# Patient Record
Sex: Female | Born: 1940 | Race: White | Hispanic: No | Marital: Married | Smoking: Never smoker
Health system: Southern US, Community
[De-identification: ages and names within clinical notes are randomized; demographics above are authoritative.]

## PROBLEM LIST (undated history)

## (undated) DIAGNOSIS — L4050 Arthropathic psoriasis, unspecified: Secondary | ICD-10-CM

## (undated) DIAGNOSIS — E559 Vitamin D deficiency, unspecified: Secondary | ICD-10-CM

## (undated) DIAGNOSIS — I10 Essential (primary) hypertension: Secondary | ICD-10-CM

## (undated) DIAGNOSIS — E785 Hyperlipidemia, unspecified: Secondary | ICD-10-CM

## (undated) DIAGNOSIS — L821 Other seborrheic keratosis: Secondary | ICD-10-CM

## (undated) DIAGNOSIS — M722 Plantar fascial fibromatosis: Secondary | ICD-10-CM

## (undated) DIAGNOSIS — K219 Gastro-esophageal reflux disease without esophagitis: Secondary | ICD-10-CM

## (undated) HISTORY — DX: Essential (primary) hypertension: I10

## (undated) HISTORY — DX: Other seborrheic keratosis: L82.1

## (undated) HISTORY — PX: TOTAL ABDOMINAL HYSTERECTOMY: SHX209

## (undated) HISTORY — DX: Hyperlipidemia, unspecified: E78.5

## (undated) HISTORY — DX: Plantar fascial fibromatosis: M72.2

## (undated) HISTORY — DX: Arthropathic psoriasis, unspecified: L40.50

## (undated) HISTORY — DX: Vitamin D deficiency, unspecified: E55.9

## (undated) HISTORY — DX: Gastro-esophageal reflux disease without esophagitis: K21.9

---

## 2003-02-02 ENCOUNTER — Ambulatory Visit (HOSPITAL_COMMUNITY): Admission: RE | Admit: 2003-02-02 | Discharge: 2003-02-02 | Payer: Self-pay | Admitting: *Deleted

## 2003-08-19 ENCOUNTER — Other Ambulatory Visit: Admission: RE | Admit: 2003-08-19 | Discharge: 2003-08-19 | Payer: Self-pay | Admitting: Internal Medicine

## 2006-07-10 ENCOUNTER — Other Ambulatory Visit: Admission: RE | Admit: 2006-07-10 | Discharge: 2006-07-10 | Payer: Self-pay | Admitting: Internal Medicine

## 2008-08-14 ENCOUNTER — Other Ambulatory Visit: Admission: RE | Admit: 2008-08-14 | Discharge: 2008-08-14 | Payer: Self-pay | Admitting: Internal Medicine

## 2009-08-17 ENCOUNTER — Other Ambulatory Visit: Admission: RE | Admit: 2009-08-17 | Discharge: 2009-08-17 | Payer: Self-pay | Admitting: Internal Medicine

## 2010-08-26 NOTE — Op Note (Signed)
   NAME:  Claudia Santana, Claudia Santana                        ACCOUNT NO.:  [id number]   MEDICAL RECORD NO.:  [id number]                   PATIENT TYPE:  AMB   LOCATION:  ENDO                                 FACILITY:  [facility]   PHYSICIAN:  Georgiana Spinner, M.D.                 DATE OF BIRTH:  Oct 09, 1940   DATE OF PROCEDURE:  DATE OF DISCHARGE:                                 OPERATIVE REPORT   PROCEDURE:  Upper endoscopy.   INDICATIONS:  Hemoccult positivity.   ANESTHESIA:  Demerol 60, Versed 7 mg.   PROCEDURE:  With the patient mildly sedated in the left lateral decubitus  position the Olympus video-endoscope was inserted to the mouth and entered  under direct vision through the esophagus, which appeared normal.  There was  no evidence of Barrett esophagus or esophagitis.  We entered into the  stomach.  The fundus body, antrum, duodenal bulb, second portion of the  duodenum all appeared normal.  From this point, the endoscope was slowly  withdrawn taking circumferential views of the duodenum and mucosa until the  endoscope had been pulled back into the stomach and placed in retroflexion  to view the stomach from below.  The endoscope was then straightened and  withdrawn, taking circumferential views of the remaining gastric and  esophageal mucosa.   The patient's vital signs and pulse oximeter remained stable.  The patient  tolerated the procedure well without apparent complications.   FINDINGS:  Unremarkable exam.   PLAN:  Proceed to colonoscopy.                                               Georgiana Spinner, M.D.    GMO/MEDQ  D:  02/02/2003  T:  02/02/2003  Job:  [id number]   cc:   Candyce Churn, M.D.  [address] [place]  [place] [address]  Fax: [phone]

## 2010-08-26 NOTE — Op Note (Signed)
   NAME:  Claudia Santana, Claudia Santana                        ACCOUNT NO.:  [id number]   MEDICAL RECORD NO.:  [id number]                   PATIENT TYPE:  AMB   LOCATION:  ENDO                                 FACILITY:  [facility]   PHYSICIAN:  Georgiana Spinner, M.D.                 DATE OF BIRTH:  1940-04-28   DATE OF PROCEDURE:  DATE OF DISCHARGE:                                 OPERATIVE REPORT   PROCEDURE:  Colonoscopy.   INDICATION:  Hemoccult positivity.   ANESTHESIA:  Demerol 80 mg, Versed 8 mg.   DESCRIPTION OF PROCEDURE:  With the patient mildly sedated in the left  lateral decubitus position, the Olympus videoscopic variable stiffness  colonoscope was inserted into the rectum and passed under direct vision to  the right colon, but because of adhesions in abdomen we could not advanced  the scope passed this despite turning the patient in multiple positions and  subsequently using abdominal pressure.  Therefore, this was withdrawn and  the regular colonoscope on variable stiffness CF160 was inserted into the  rectum and it too was passed under direct vision through this very tortuous,  adhesion tethered colon with the patient placed in various positions, but  mostly in the right lateral decubitus position.  After we reached the area  of turn in the colon in the pelvis, we were subsequently able, with pressure  applied, to reach the cecum.  The cecum was identified by the ileocecal  valve and appendiceal orifice both of which were photographed.  From this  point, the colonoscope was slowly withdrawn, taking circumferential views of  the colonic mucosa, stopping only to cleanse and suction fecal material that  was greenish in color, until we reached back to the rectum which appeared  normal under direct vision and showed hemorrhoids on retroflex view.  The  endoscope was straightened and withdrawn.  The patient's vital signs and  pulse oximetry remained stable and the patient tolerated the  procedure well  without apparent complications.   FINDINGS:  A very difficult colonoscopic examination with warrant of a long  thicker scope with pressure applied and the patient turned mostly on the  right side when we reached back into the pelvis with the transverse colon,  but otherwise negative examination except for hemorrhoids.   PLAN:  The patient follow up with me as needed.                                               Georgiana Spinner, M.D.    GMO/MEDQ  D:  02/02/2003  T:  02/02/2003  Job:  [id number]   cc:   Candyce Churn, M.D.  [address] [place]  [place] [address]  Fax: [phone]

## 2012-08-22 ENCOUNTER — Other Ambulatory Visit: Payer: Self-pay | Admitting: Gastroenterology

## 2012-08-22 DIAGNOSIS — R1310 Dysphagia, unspecified: Secondary | ICD-10-CM

## 2012-09-03 ENCOUNTER — Ambulatory Visit
Admission: RE | Admit: 2012-09-03 | Discharge: 2012-09-03 | Disposition: A | Discharge status: Home / Self Care | Payer: Medicare Other | Source: Ambulatory Visit | Attending: Gastroenterology | Admitting: Gastroenterology

## 2012-09-03 DIAGNOSIS — R1310 Dysphagia, unspecified: Secondary | ICD-10-CM

## 2012-09-12 ENCOUNTER — Other Ambulatory Visit: Payer: Self-pay | Admitting: Gastroenterology

## 2012-10-15 ENCOUNTER — Encounter (HOSPITAL_COMMUNITY): Payer: Self-pay

## 2012-10-15 ENCOUNTER — Ambulatory Visit (HOSPITAL_COMMUNITY): Admit: 2012-10-15 | Payer: Self-pay | Admitting: Gastroenterology

## 2012-10-15 SURGERY — COLONOSCOPY WITH PROPOFOL
Anesthesia: Monitor Anesthesia Care

## 2013-02-04 ENCOUNTER — Other Ambulatory Visit: Payer: Self-pay | Admitting: Gastroenterology

## 2013-02-04 DIAGNOSIS — Z1211 Encounter for screening for malignant neoplasm of colon: Secondary | ICD-10-CM

## 2013-02-19 ENCOUNTER — Other Ambulatory Visit: Payer: Medicare Other

## 2013-03-05 ENCOUNTER — Other Ambulatory Visit: Payer: Self-pay | Admitting: Gastroenterology

## 2013-03-05 ENCOUNTER — Ambulatory Visit
Admission: RE | Admit: 2013-03-05 | Discharge: 2013-03-05 | Disposition: A | Discharge status: Home / Self Care | Payer: Medicare Other | Source: Ambulatory Visit | Attending: Gastroenterology | Admitting: Gastroenterology

## 2013-03-05 DIAGNOSIS — Z1211 Encounter for screening for malignant neoplasm of colon: Secondary | ICD-10-CM

## 2016-02-09 ENCOUNTER — Telehealth: Payer: Self-pay | Admitting: Cardiology

## 2016-02-09 NOTE — Telephone Encounter (Signed)
Received records from [facility] for appointment on 04/13/16 with Dr Martinique.  Records given to [facility] (medical records) for Dr Doug Sou schedule on 04/13/16. lp

## 2016-04-05 DIAGNOSIS — K120 Recurrent oral aphthae: Secondary | ICD-10-CM | POA: Insufficient documentation

## 2016-04-05 DIAGNOSIS — IMO0002 Reserved for concepts with insufficient information to code with codable children: Secondary | ICD-10-CM | POA: Insufficient documentation

## 2016-04-05 DIAGNOSIS — R634 Abnormal weight loss: Secondary | ICD-10-CM | POA: Insufficient documentation

## 2016-04-05 DIAGNOSIS — M1990 Unspecified osteoarthritis, unspecified site: Secondary | ICD-10-CM | POA: Insufficient documentation

## 2016-04-05 DIAGNOSIS — Z0000 Encounter for general adult medical examination without abnormal findings: Secondary | ICD-10-CM | POA: Insufficient documentation

## 2016-04-05 DIAGNOSIS — E559 Vitamin D deficiency, unspecified: Secondary | ICD-10-CM | POA: Insufficient documentation

## 2016-04-05 DIAGNOSIS — N819 Female genital prolapse, unspecified: Secondary | ICD-10-CM | POA: Insufficient documentation

## 2016-04-05 DIAGNOSIS — N8110 Cystocele, unspecified: Secondary | ICD-10-CM | POA: Insufficient documentation

## 2016-04-05 DIAGNOSIS — E785 Hyperlipidemia, unspecified: Secondary | ICD-10-CM | POA: Insufficient documentation

## 2016-04-05 DIAGNOSIS — M791 Myalgia, unspecified site: Secondary | ICD-10-CM | POA: Insufficient documentation

## 2016-04-05 DIAGNOSIS — K921 Melena: Secondary | ICD-10-CM | POA: Insufficient documentation

## 2016-04-05 DIAGNOSIS — H6120 Impacted cerumen, unspecified ear: Secondary | ICD-10-CM | POA: Insufficient documentation

## 2016-04-05 DIAGNOSIS — N8111 Cystocele, midline: Secondary | ICD-10-CM | POA: Insufficient documentation

## 2016-04-05 DIAGNOSIS — L820 Inflamed seborrheic keratosis: Secondary | ICD-10-CM | POA: Insufficient documentation

## 2016-04-05 DIAGNOSIS — M7710 Lateral epicondylitis, unspecified elbow: Secondary | ICD-10-CM | POA: Insufficient documentation

## 2016-04-05 DIAGNOSIS — E1165 Type 2 diabetes mellitus with hyperglycemia: Secondary | ICD-10-CM | POA: Insufficient documentation

## 2016-04-05 DIAGNOSIS — K219 Gastro-esophageal reflux disease without esophagitis: Secondary | ICD-10-CM | POA: Insufficient documentation

## 2016-04-05 DIAGNOSIS — M19049 Primary osteoarthritis, unspecified hand: Secondary | ICD-10-CM | POA: Insufficient documentation

## 2016-04-05 DIAGNOSIS — I10 Essential (primary) hypertension: Secondary | ICD-10-CM | POA: Insufficient documentation

## 2016-04-05 DIAGNOSIS — Z780 Asymptomatic menopausal state: Secondary | ICD-10-CM | POA: Insufficient documentation

## 2016-04-05 DIAGNOSIS — E119 Type 2 diabetes mellitus without complications: Secondary | ICD-10-CM | POA: Insufficient documentation

## 2016-04-05 DIAGNOSIS — I8390 Asymptomatic varicose veins of unspecified lower extremity: Secondary | ICD-10-CM | POA: Insufficient documentation

## 2016-04-05 DIAGNOSIS — R1310 Dysphagia, unspecified: Secondary | ICD-10-CM | POA: Insufficient documentation

## 2016-04-05 DIAGNOSIS — J309 Allergic rhinitis, unspecified: Secondary | ICD-10-CM | POA: Insufficient documentation

## 2016-04-05 DIAGNOSIS — M189 Osteoarthritis of first carpometacarpal joint, unspecified: Secondary | ICD-10-CM | POA: Insufficient documentation

## 2016-04-05 DIAGNOSIS — N816 Rectocele: Secondary | ICD-10-CM | POA: Insufficient documentation

## 2016-04-05 DIAGNOSIS — M48061 Spinal stenosis, lumbar region without neurogenic claudication: Secondary | ICD-10-CM | POA: Insufficient documentation

## 2016-04-05 DIAGNOSIS — Z79899 Other long term (current) drug therapy: Secondary | ICD-10-CM | POA: Insufficient documentation

## 2016-04-13 ENCOUNTER — Ambulatory Visit: Payer: Self-pay | Admitting: Cardiology

## 2016-05-16 NOTE — Progress Notes (Signed)
Cardiology Office Note    Date:  05/17/2016   ID:  Claudia Santana, DOB 08/19/1940, MRN ID:[id number]  PCP:  Henrine Screws, MD  Cardiologist:  Danyal Adorno Martinique, MD    History of Present Illness:  Claudia Santana is a 76 y.o. female referred by Dr Inda Merlin for evaluation of palpitations. I have taken care of her mother and her husband. She reports that from October-December she felt palpitations. She was concerned she might have Afib but when she checked her pulse it was normal. She was under a lot of stress at that time and since her stress has abated her symptoms have gone away. No lightheadedness, dizziness, chest pain, SOB, syncope. No prior cardiac history. States she has white coat HTN and that BP at home never over 140. No prior cardiac history.  Past Medical History:  Diagnosis Date  . GERD (gastroesophageal reflux disease)   . Hyperlipidemia   . Hypertension   . Plantar fasciitis   . Psoriatic arthritis ([place])   . Seborrheic keratoses   . Vitamin D deficiency     Past Surgical History:  Procedure Laterality Date  . TOTAL ABDOMINAL HYSTERECTOMY      Current Medications: Outpatient Medications Prior to Visit  Medication Sig Dispense Refill  . Biotin 10 MG TABS Take 1 tablet by mouth daily.    . calcium carbonate (CALCIUM 600) 600 MG TABS tablet Take 2 tablets by mouth daily.    . Cholecalciferol (VITAMIN D3) 2000 units capsule Take 1 capsule by mouth daily.    . Dulaglutide (TRULICITY) A999333 0000000 SOPN Inject 0.75 mg into the skin once a week.    . esomeprazole (NEXIUM) 40 MG capsule Take 40 mg by mouth 3 (three) times a week.    . Estradiol 50 MG PLLT Take 1 tablet by mouth 3 (three) times a week.    . fexofenadine (ALLEGRA ALLERGY) 180 MG tablet Take 1 tablet by mouth daily.    . fluticasone (FLONASE) 50 MCG/ACT nasal spray Place 2 sprays into both nostrils daily as needed.    . Ginger 500 MG CAPS Take 500 mg by mouth daily as needed.    . Inositol Niacinate (NIACIN  FLUSH FREE) 500 MG CAPS Take 1 capsule by mouth daily.    . Magnesium 300 MG CAPS Take 2 capsules by mouth daily.    . metFORMIN (GLUCOPHAGE-XR) 500 MG 24 hr tablet Take 2 tablets by mouth daily.    . Multiple Vitamin (MULTI-VITAMINS) TABS Take 1 tablet by mouth daily.    . Probiotic CAPS     . Zinc 10 MG LOZG Take 10 mg by mouth daily.     No facility-administered medications prior to visit.      Allergies:   Amoxicillin; Atorvastatin; Ezetimibe; Niaspan [niacin er]; and Tiazac [diltiazem hcl er beads]   Social History   Social History  . Marital status: Married    Spouse name: N/A  . Number of children: N/A  . Years of education: N/A   Social History Main Topics  . Smoking status: Never Smoker  . Smokeless tobacco: Never Used  . Alcohol use None  . Drug use: Unknown  . Sexual activity: Not Asked   Other Topics Concern  . None   Social History Narrative  . None     Family History:  The patient's family history includes Arthritis in her sister; CVA in her mother; Diabetes in her father; Heart disease in her mother; Hypertension in her father.  ROS:   Please see the history of present illness.    ROS All other systems reviewed and are negative.   PHYSICAL EXAM:   VS:  BP (!) 190/90   Pulse 80   Ht 5' 7.5" (1.715 m)   Wt 170 lb 3.2 oz (77.2 kg)   BMI 26.26 kg/m    GEN: Well nourished, well developed, in no acute distress  HEENT: normal  Neck: no JVD, carotid bruits, or masses Cardiac: RRR; no murmurs, rubs, or gallops,no edema  Respiratory:  clear to auscultation bilaterally, normal work of breathing GI: soft, nontender, nondistended, + BS MS: no deformity or atrophy  Skin: warm and dry, no rash Neuro:  Alert and Oriented x 3, Strength and sensation are intact Psych: euthymic mood, full affect  Wt Readings from Last 3 Encounters:  05/17/16 170 lb 3.2 oz (77.2 kg)      Studies/Labs Reviewed:   EKG:  EKG is ordered today.  The ekg ordered today  demonstrates NSR with normal Ecg. I have personally reviewed and interpreted this study.   Recent Labs: No results found for requested labs within last 8760 hours.   Lipid Panel No results found for: CHOL, TRIG, HDL, CHOLHDL, VLDL, LDLCALC, LDLDIRECT  Additional studies/ records that were reviewed today include:  Labs dated 10/06/15: cholesterol 245, triglycerides 310, HDL 66, LDL 118. A1c 10.9%. CMET, TSH, CBC normal.   ASSESSMENT:    1. Palpitations   2. Essential hypertension      PLAN:  In order of problems listed above:  1. I think her palpitations were more related to stress and anxiety. Symptoms now resolved. Ecg and exam normal. Reassured. No further work up needed now. If symptoms recur could consider having her wear a monitor. Minimize caffeine intake. Follow up prn. 2. BP is high today. Reports normal readings at home. Follow up with primary care 3. Lipids reviewed. High triglycerides may reflect poor glycemic control. Will focus on treating DM. Intolerant of statin and Zetia. Do not recommend PCSK9 inhibitors for primary prevention.    Medication Adjustments/Labs and Tests Ordered: Current medicines are reviewed at length with the patient today.  Concerns regarding medicines are outlined above.  Medication changes, Labs and Tests ordered today are listed in the Patient Instructions below. Patient Instructions  Continue your current therapy  I will see you as needed.      Signed, Modupe Shampine Martinique, MD  05/17/2016 10:40 AM    [facility] [address], [place], [place], [address] [phone]

## 2016-05-17 ENCOUNTER — Ambulatory Visit (INDEPENDENT_AMBULATORY_CARE_PROVIDER_SITE_OTHER): Payer: Medicare Other | Admitting: Cardiology

## 2016-05-17 ENCOUNTER — Encounter: Payer: Self-pay | Admitting: Cardiology

## 2016-05-17 VITALS — BP 190/90 | HR 80 | Ht 67.5 in | Wt 170.2 lb

## 2016-05-17 DIAGNOSIS — R002 Palpitations: Secondary | ICD-10-CM

## 2016-05-17 DIAGNOSIS — I10 Essential (primary) hypertension: Secondary | ICD-10-CM

## 2016-05-17 NOTE — Patient Instructions (Signed)
Continue your current therapy  I will see you as needed. 

## 2017-05-09 DIAGNOSIS — B0229 Other postherpetic nervous system involvement: Secondary | ICD-10-CM | POA: Diagnosis not present

## 2017-06-13 DIAGNOSIS — L570 Actinic keratosis: Secondary | ICD-10-CM | POA: Diagnosis not present

## 2017-06-13 DIAGNOSIS — L821 Other seborrheic keratosis: Secondary | ICD-10-CM | POA: Diagnosis not present

## 2017-06-13 DIAGNOSIS — Z85828 Personal history of other malignant neoplasm of skin: Secondary | ICD-10-CM | POA: Diagnosis not present

## 2017-06-13 DIAGNOSIS — L738 Other specified follicular disorders: Secondary | ICD-10-CM | POA: Diagnosis not present

## 2017-06-13 DIAGNOSIS — L820 Inflamed seborrheic keratosis: Secondary | ICD-10-CM | POA: Diagnosis not present

## 2017-06-13 DIAGNOSIS — D1801 Hemangioma of skin and subcutaneous tissue: Secondary | ICD-10-CM | POA: Diagnosis not present

## 2017-08-31 DIAGNOSIS — B0229 Other postherpetic nervous system involvement: Secondary | ICD-10-CM | POA: Diagnosis not present

## 2017-08-31 DIAGNOSIS — R69 Illness, unspecified: Secondary | ICD-10-CM | POA: Diagnosis not present

## 2017-09-19 DIAGNOSIS — N8110 Cystocele, unspecified: Secondary | ICD-10-CM | POA: Diagnosis not present

## 2017-09-19 DIAGNOSIS — N816 Rectocele: Secondary | ICD-10-CM | POA: Diagnosis not present

## 2017-09-19 DIAGNOSIS — Z4689 Encounter for fitting and adjustment of other specified devices: Secondary | ICD-10-CM | POA: Diagnosis not present

## 2017-09-20 DIAGNOSIS — R69 Illness, unspecified: Secondary | ICD-10-CM | POA: Diagnosis not present

## 2017-09-20 DIAGNOSIS — R195 Other fecal abnormalities: Secondary | ICD-10-CM | POA: Diagnosis not present

## 2017-09-20 DIAGNOSIS — B0229 Other postherpetic nervous system involvement: Secondary | ICD-10-CM | POA: Diagnosis not present

## 2017-11-07 DIAGNOSIS — Z7984 Long term (current) use of oral hypoglycemic drugs: Secondary | ICD-10-CM | POA: Diagnosis not present

## 2017-11-07 DIAGNOSIS — E559 Vitamin D deficiency, unspecified: Secondary | ICD-10-CM | POA: Diagnosis not present

## 2017-11-07 DIAGNOSIS — R69 Illness, unspecified: Secondary | ICD-10-CM | POA: Diagnosis not present

## 2017-11-07 DIAGNOSIS — Z0000 Encounter for general adult medical examination without abnormal findings: Secondary | ICD-10-CM | POA: Diagnosis not present

## 2017-11-07 DIAGNOSIS — E7800 Pure hypercholesterolemia, unspecified: Secondary | ICD-10-CM | POA: Diagnosis not present

## 2017-11-07 DIAGNOSIS — Z79899 Other long term (current) drug therapy: Secondary | ICD-10-CM | POA: Diagnosis not present

## 2017-11-07 DIAGNOSIS — K219 Gastro-esophageal reflux disease without esophagitis: Secondary | ICD-10-CM | POA: Diagnosis not present

## 2017-11-07 DIAGNOSIS — N8110 Cystocele, unspecified: Secondary | ICD-10-CM | POA: Diagnosis not present

## 2017-11-07 DIAGNOSIS — E1165 Type 2 diabetes mellitus with hyperglycemia: Secondary | ICD-10-CM | POA: Diagnosis not present

## 2017-11-07 DIAGNOSIS — Z1389 Encounter for screening for other disorder: Secondary | ICD-10-CM | POA: Diagnosis not present

## 2017-11-07 DIAGNOSIS — I10 Essential (primary) hypertension: Secondary | ICD-10-CM | POA: Diagnosis not present

## 2017-11-07 DIAGNOSIS — M4807 Spinal stenosis, lumbosacral region: Secondary | ICD-10-CM | POA: Diagnosis not present

## 2017-11-07 DIAGNOSIS — B0229 Other postherpetic nervous system involvement: Secondary | ICD-10-CM | POA: Diagnosis not present

## 2017-12-03 DIAGNOSIS — K439 Ventral hernia without obstruction or gangrene: Secondary | ICD-10-CM | POA: Diagnosis not present

## 2017-12-12 DIAGNOSIS — Z961 Presence of intraocular lens: Secondary | ICD-10-CM | POA: Diagnosis not present

## 2017-12-12 DIAGNOSIS — E119 Type 2 diabetes mellitus without complications: Secondary | ICD-10-CM | POA: Diagnosis not present

## 2017-12-12 DIAGNOSIS — H52203 Unspecified astigmatism, bilateral: Secondary | ICD-10-CM | POA: Diagnosis not present

## 2017-12-12 DIAGNOSIS — H524 Presbyopia: Secondary | ICD-10-CM | POA: Diagnosis not present

## 2018-01-03 DIAGNOSIS — Z79890 Hormone replacement therapy (postmenopausal): Secondary | ICD-10-CM | POA: Diagnosis not present

## 2018-01-03 DIAGNOSIS — Z01411 Encounter for gynecological examination (general) (routine) with abnormal findings: Secondary | ICD-10-CM | POA: Diagnosis not present

## 2018-01-03 DIAGNOSIS — Z960 Presence of urogenital implants: Secondary | ICD-10-CM | POA: Diagnosis not present

## 2018-01-03 DIAGNOSIS — R1012 Left upper quadrant pain: Secondary | ICD-10-CM | POA: Diagnosis not present

## 2018-01-16 ENCOUNTER — Other Ambulatory Visit: Payer: Self-pay | Admitting: Internal Medicine

## 2018-01-16 DIAGNOSIS — R1032 Left lower quadrant pain: Secondary | ICD-10-CM | POA: Diagnosis not present

## 2018-01-16 DIAGNOSIS — L84 Corns and callosities: Secondary | ICD-10-CM | POA: Diagnosis not present

## 2018-01-17 ENCOUNTER — Ambulatory Visit
Admission: RE | Admit: 2018-01-17 | Discharge: 2018-01-17 | Disposition: A | Discharge status: Home / Self Care | Payer: Medicare HMO | Source: Ambulatory Visit | Attending: Internal Medicine | Admitting: Internal Medicine

## 2018-01-17 DIAGNOSIS — R1032 Left lower quadrant pain: Secondary | ICD-10-CM

## 2018-01-17 DIAGNOSIS — K8020 Calculus of gallbladder without cholecystitis without obstruction: Secondary | ICD-10-CM | POA: Diagnosis not present

## 2018-01-17 MED ORDER — IOPAMIDOL (ISOVUE-300) INJECTION 61%
100.0000 mL | Freq: Once | INTRAVENOUS | Status: AC | PRN
  Administered 2018-01-17: 100 mL via INTRAVENOUS

## 2018-01-23 DIAGNOSIS — K8020 Calculus of gallbladder without cholecystitis without obstruction: Secondary | ICD-10-CM | POA: Diagnosis not present

## 2018-01-23 DIAGNOSIS — R1084 Generalized abdominal pain: Secondary | ICD-10-CM | POA: Diagnosis not present

## 2018-02-20 DIAGNOSIS — R198 Other specified symptoms and signs involving the digestive system and abdomen: Secondary | ICD-10-CM | POA: Diagnosis not present

## 2018-02-20 DIAGNOSIS — B0229 Other postherpetic nervous system involvement: Secondary | ICD-10-CM | POA: Diagnosis not present

## 2018-04-12 ENCOUNTER — Telehealth: Payer: Self-pay | Admitting: Cardiology

## 2018-04-12 NOTE — Telephone Encounter (Signed)
Follow Up:     She is returning your call. She said if she is not there, please leave a detailed message.

## 2018-04-12 NOTE — Telephone Encounter (Signed)
Returned call to patient.She was calling about husband Herbie Baltimore.See previous note in Robert's chart.

## 2019-04-21 ENCOUNTER — Ambulatory Visit: Payer: Medicare Other | Attending: Internal Medicine

## 2019-04-21 DIAGNOSIS — Z23 Encounter for immunization: Secondary | ICD-10-CM

## 2019-04-21 NOTE — Progress Notes (Signed)
   Covid-19 Vaccination Clinic  Name:  Claudia Santana    MRN: [id number] DOB: [birth year]  04/21/2019  Claudia Santana was observed post Covid-19 immunization for 30 minutes based on pre-vaccination screening without incidence. She was provided with Vaccine Information Sheet and instruction to access the V-Safe system.   Claudia Santana was instructed to call 911 with any severe reactions post vaccine: Marland Kitchen Difficulty breathing  . Swelling of your face and throat  . A fast heartbeat  . A bad rash all over your body  . Dizziness and weakness    Immunizations Administered    Name Date Dose VIS Date Route   Pfizer COVID-19 Vaccine 04/21/2019 10:39 AM 0.3 mL 03/21/2019 Intramuscular   Manufacturer: [employer], [employer]   Lot: [id number]   [place]: [id number]

## 2019-04-22 ENCOUNTER — Ambulatory Visit: Payer: Medicare Other

## 2019-05-09 ENCOUNTER — Ambulatory Visit: Payer: Medicare Other

## 2019-05-10 ENCOUNTER — Ambulatory Visit: Payer: Medicare Other | Attending: Internal Medicine

## 2019-05-10 DIAGNOSIS — Z23 Encounter for immunization: Secondary | ICD-10-CM

## 2019-05-10 NOTE — Progress Notes (Signed)
   Covid-19 Vaccination Clinic  Name:  Claudia Santana    MRN: [id number] DOB: 1940/12/16  05/10/2019  [name] was observed post Covid-19 immunization for 15 minutes without incidence. She was provided with Vaccine Information Sheet and instruction to access the V-Safe system.   [name] was instructed to call 911 with any severe reactions post vaccine: Marland Kitchen Difficulty breathing  . Swelling of your face and throat  . A fast heartbeat  . A bad rash all over your body  . Dizziness and weakness    Immunizations Administered    Name Date Dose VIS Date Route   Pfizer COVID-19 Vaccine 05/10/2019  1:29 PM 0.3 mL 03/21/2019 Intramuscular   Manufacturer: [place]   Lot: [id number]   [place]: [id number]

## 2020-05-05 DIAGNOSIS — E7800 Pure hypercholesterolemia, unspecified: Secondary | ICD-10-CM | POA: Diagnosis not present

## 2020-05-05 DIAGNOSIS — E1169 Type 2 diabetes mellitus with other specified complication: Secondary | ICD-10-CM | POA: Diagnosis not present

## 2020-05-05 DIAGNOSIS — K219 Gastro-esophageal reflux disease without esophagitis: Secondary | ICD-10-CM | POA: Diagnosis not present

## 2020-05-05 DIAGNOSIS — E1165 Type 2 diabetes mellitus with hyperglycemia: Secondary | ICD-10-CM | POA: Diagnosis not present

## 2020-05-05 DIAGNOSIS — M189 Osteoarthritis of first carpometacarpal joint, unspecified: Secondary | ICD-10-CM | POA: Diagnosis not present

## 2020-05-05 DIAGNOSIS — E119 Type 2 diabetes mellitus without complications: Secondary | ICD-10-CM | POA: Diagnosis not present

## 2020-05-05 DIAGNOSIS — I10 Essential (primary) hypertension: Secondary | ICD-10-CM | POA: Diagnosis not present

## 2020-06-04 DIAGNOSIS — K219 Gastro-esophageal reflux disease without esophagitis: Secondary | ICD-10-CM | POA: Diagnosis not present

## 2020-06-04 DIAGNOSIS — E7800 Pure hypercholesterolemia, unspecified: Secondary | ICD-10-CM | POA: Diagnosis not present

## 2020-06-04 DIAGNOSIS — M189 Osteoarthritis of first carpometacarpal joint, unspecified: Secondary | ICD-10-CM | POA: Diagnosis not present

## 2020-06-04 DIAGNOSIS — I10 Essential (primary) hypertension: Secondary | ICD-10-CM | POA: Diagnosis not present

## 2020-06-04 DIAGNOSIS — E1169 Type 2 diabetes mellitus with other specified complication: Secondary | ICD-10-CM | POA: Diagnosis not present

## 2020-06-04 DIAGNOSIS — E1165 Type 2 diabetes mellitus with hyperglycemia: Secondary | ICD-10-CM | POA: Diagnosis not present

## 2020-06-15 DIAGNOSIS — K219 Gastro-esophageal reflux disease without esophagitis: Secondary | ICD-10-CM | POA: Diagnosis not present

## 2020-06-15 DIAGNOSIS — I10 Essential (primary) hypertension: Secondary | ICD-10-CM | POA: Diagnosis not present

## 2020-06-15 DIAGNOSIS — E1169 Type 2 diabetes mellitus with other specified complication: Secondary | ICD-10-CM | POA: Diagnosis not present

## 2020-06-15 DIAGNOSIS — E1165 Type 2 diabetes mellitus with hyperglycemia: Secondary | ICD-10-CM | POA: Diagnosis not present

## 2020-06-15 DIAGNOSIS — E119 Type 2 diabetes mellitus without complications: Secondary | ICD-10-CM | POA: Diagnosis not present

## 2020-06-15 DIAGNOSIS — E7800 Pure hypercholesterolemia, unspecified: Secondary | ICD-10-CM | POA: Diagnosis not present

## 2020-06-15 DIAGNOSIS — M189 Osteoarthritis of first carpometacarpal joint, unspecified: Secondary | ICD-10-CM | POA: Diagnosis not present

## 2020-07-06 DIAGNOSIS — L570 Actinic keratosis: Secondary | ICD-10-CM | POA: Diagnosis not present

## 2020-08-06 DIAGNOSIS — E1165 Type 2 diabetes mellitus with hyperglycemia: Secondary | ICD-10-CM | POA: Diagnosis not present

## 2020-08-06 DIAGNOSIS — I10 Essential (primary) hypertension: Secondary | ICD-10-CM | POA: Diagnosis not present

## 2020-08-06 DIAGNOSIS — E7800 Pure hypercholesterolemia, unspecified: Secondary | ICD-10-CM | POA: Diagnosis not present

## 2020-08-06 DIAGNOSIS — M189 Osteoarthritis of first carpometacarpal joint, unspecified: Secondary | ICD-10-CM | POA: Diagnosis not present

## 2020-08-06 DIAGNOSIS — E1169 Type 2 diabetes mellitus with other specified complication: Secondary | ICD-10-CM | POA: Diagnosis not present

## 2020-08-06 DIAGNOSIS — K219 Gastro-esophageal reflux disease without esophagitis: Secondary | ICD-10-CM | POA: Diagnosis not present

## 2020-08-06 DIAGNOSIS — E119 Type 2 diabetes mellitus without complications: Secondary | ICD-10-CM | POA: Diagnosis not present

## 2020-08-18 DIAGNOSIS — E1165 Type 2 diabetes mellitus with hyperglycemia: Secondary | ICD-10-CM | POA: Diagnosis not present

## 2020-08-18 DIAGNOSIS — Z7984 Long term (current) use of oral hypoglycemic drugs: Secondary | ICD-10-CM | POA: Diagnosis not present

## 2020-08-18 DIAGNOSIS — M189 Osteoarthritis of first carpometacarpal joint, unspecified: Secondary | ICD-10-CM | POA: Diagnosis not present

## 2020-08-18 DIAGNOSIS — E1169 Type 2 diabetes mellitus with other specified complication: Secondary | ICD-10-CM | POA: Diagnosis not present

## 2020-08-18 DIAGNOSIS — E7800 Pure hypercholesterolemia, unspecified: Secondary | ICD-10-CM | POA: Diagnosis not present

## 2020-08-18 DIAGNOSIS — I10 Essential (primary) hypertension: Secondary | ICD-10-CM | POA: Diagnosis not present

## 2020-08-23 DIAGNOSIS — L820 Inflamed seborrheic keratosis: Secondary | ICD-10-CM | POA: Diagnosis not present

## 2020-08-23 DIAGNOSIS — L570 Actinic keratosis: Secondary | ICD-10-CM | POA: Diagnosis not present

## 2020-08-25 DIAGNOSIS — M189 Osteoarthritis of first carpometacarpal joint, unspecified: Secondary | ICD-10-CM | POA: Diagnosis not present

## 2020-08-25 DIAGNOSIS — E1165 Type 2 diabetes mellitus with hyperglycemia: Secondary | ICD-10-CM | POA: Diagnosis not present

## 2020-08-25 DIAGNOSIS — E1169 Type 2 diabetes mellitus with other specified complication: Secondary | ICD-10-CM | POA: Diagnosis not present

## 2020-08-25 DIAGNOSIS — E7800 Pure hypercholesterolemia, unspecified: Secondary | ICD-10-CM | POA: Diagnosis not present

## 2020-08-25 DIAGNOSIS — E119 Type 2 diabetes mellitus without complications: Secondary | ICD-10-CM | POA: Diagnosis not present

## 2020-08-25 DIAGNOSIS — K219 Gastro-esophageal reflux disease without esophagitis: Secondary | ICD-10-CM | POA: Diagnosis not present

## 2020-08-25 DIAGNOSIS — I10 Essential (primary) hypertension: Secondary | ICD-10-CM | POA: Diagnosis not present

## 2020-10-22 DIAGNOSIS — E7800 Pure hypercholesterolemia, unspecified: Secondary | ICD-10-CM | POA: Diagnosis not present

## 2020-10-22 DIAGNOSIS — K219 Gastro-esophageal reflux disease without esophagitis: Secondary | ICD-10-CM | POA: Diagnosis not present

## 2020-10-22 DIAGNOSIS — I10 Essential (primary) hypertension: Secondary | ICD-10-CM | POA: Diagnosis not present

## 2020-10-22 DIAGNOSIS — E1165 Type 2 diabetes mellitus with hyperglycemia: Secondary | ICD-10-CM | POA: Diagnosis not present

## 2020-10-22 DIAGNOSIS — E119 Type 2 diabetes mellitus without complications: Secondary | ICD-10-CM | POA: Diagnosis not present

## 2020-10-22 DIAGNOSIS — E1169 Type 2 diabetes mellitus with other specified complication: Secondary | ICD-10-CM | POA: Diagnosis not present

## 2020-10-22 DIAGNOSIS — M189 Osteoarthritis of first carpometacarpal joint, unspecified: Secondary | ICD-10-CM | POA: Diagnosis not present

## 2020-11-22 DIAGNOSIS — E7800 Pure hypercholesterolemia, unspecified: Secondary | ICD-10-CM | POA: Diagnosis not present

## 2020-11-22 DIAGNOSIS — K219 Gastro-esophageal reflux disease without esophagitis: Secondary | ICD-10-CM | POA: Diagnosis not present

## 2020-11-22 DIAGNOSIS — I10 Essential (primary) hypertension: Secondary | ICD-10-CM | POA: Diagnosis not present

## 2020-11-22 DIAGNOSIS — E1165 Type 2 diabetes mellitus with hyperglycemia: Secondary | ICD-10-CM | POA: Diagnosis not present

## 2020-11-22 DIAGNOSIS — E1169 Type 2 diabetes mellitus with other specified complication: Secondary | ICD-10-CM | POA: Diagnosis not present

## 2020-11-30 DIAGNOSIS — Z7984 Long term (current) use of oral hypoglycemic drugs: Secondary | ICD-10-CM | POA: Diagnosis not present

## 2020-11-30 DIAGNOSIS — E1165 Type 2 diabetes mellitus with hyperglycemia: Secondary | ICD-10-CM | POA: Diagnosis not present

## 2020-11-30 DIAGNOSIS — M4807 Spinal stenosis, lumbosacral region: Secondary | ICD-10-CM | POA: Diagnosis not present

## 2020-11-30 DIAGNOSIS — R413 Other amnesia: Secondary | ICD-10-CM | POA: Diagnosis not present

## 2020-11-30 DIAGNOSIS — E1169 Type 2 diabetes mellitus with other specified complication: Secondary | ICD-10-CM | POA: Diagnosis not present

## 2020-11-30 DIAGNOSIS — I10 Essential (primary) hypertension: Secondary | ICD-10-CM | POA: Diagnosis not present

## 2020-12-23 DIAGNOSIS — I10 Essential (primary) hypertension: Secondary | ICD-10-CM | POA: Diagnosis not present

## 2020-12-24 DIAGNOSIS — E7800 Pure hypercholesterolemia, unspecified: Secondary | ICD-10-CM | POA: Diagnosis not present

## 2020-12-24 DIAGNOSIS — E1165 Type 2 diabetes mellitus with hyperglycemia: Secondary | ICD-10-CM | POA: Diagnosis not present

## 2020-12-24 DIAGNOSIS — E1169 Type 2 diabetes mellitus with other specified complication: Secondary | ICD-10-CM | POA: Diagnosis not present

## 2020-12-24 DIAGNOSIS — I10 Essential (primary) hypertension: Secondary | ICD-10-CM | POA: Diagnosis not present

## 2020-12-24 DIAGNOSIS — K219 Gastro-esophageal reflux disease without esophagitis: Secondary | ICD-10-CM | POA: Diagnosis not present

## 2021-01-04 DIAGNOSIS — R509 Fever, unspecified: Secondary | ICD-10-CM | POA: Diagnosis not present

## 2021-01-14 DIAGNOSIS — G500 Trigeminal neuralgia: Secondary | ICD-10-CM | POA: Diagnosis not present

## 2021-01-20 ENCOUNTER — Other Ambulatory Visit: Payer: Self-pay | Admitting: Internal Medicine

## 2021-01-20 DIAGNOSIS — R519 Headache, unspecified: Secondary | ICD-10-CM

## 2021-01-20 DIAGNOSIS — R112 Nausea with vomiting, unspecified: Secondary | ICD-10-CM

## 2021-01-21 ENCOUNTER — Ambulatory Visit
Admission: RE | Admit: 2021-01-21 | Discharge: 2021-01-21 | Disposition: A | Discharge status: Home / Self Care | Payer: Medicare Other | Source: Ambulatory Visit | Attending: Internal Medicine | Admitting: Internal Medicine

## 2021-01-21 ENCOUNTER — Other Ambulatory Visit: Payer: Self-pay

## 2021-01-21 DIAGNOSIS — R519 Headache, unspecified: Secondary | ICD-10-CM

## 2021-01-21 DIAGNOSIS — G44209 Tension-type headache, unspecified, not intractable: Secondary | ICD-10-CM | POA: Diagnosis not present

## 2021-01-21 DIAGNOSIS — R112 Nausea with vomiting, unspecified: Secondary | ICD-10-CM

## 2021-01-21 DIAGNOSIS — I6789 Other cerebrovascular disease: Secondary | ICD-10-CM | POA: Diagnosis not present

## 2021-01-21 MED ORDER — IOPAMIDOL (ISOVUE-300) INJECTION 61%
75.0000 mL | Freq: Once | INTRAVENOUS | Status: AC | PRN
  Administered 2021-01-21: 75 mL via INTRAVENOUS

## 2021-02-02 DIAGNOSIS — R519 Headache, unspecified: Secondary | ICD-10-CM | POA: Diagnosis not present

## 2021-02-02 DIAGNOSIS — G500 Trigeminal neuralgia: Secondary | ICD-10-CM | POA: Diagnosis not present

## 2021-02-04 DIAGNOSIS — K219 Gastro-esophageal reflux disease without esophagitis: Secondary | ICD-10-CM | POA: Diagnosis not present

## 2021-02-04 DIAGNOSIS — I10 Essential (primary) hypertension: Secondary | ICD-10-CM | POA: Diagnosis not present

## 2021-02-04 DIAGNOSIS — E7800 Pure hypercholesterolemia, unspecified: Secondary | ICD-10-CM | POA: Diagnosis not present

## 2021-02-04 DIAGNOSIS — E1169 Type 2 diabetes mellitus with other specified complication: Secondary | ICD-10-CM | POA: Diagnosis not present

## 2021-02-04 DIAGNOSIS — E1165 Type 2 diabetes mellitus with hyperglycemia: Secondary | ICD-10-CM | POA: Diagnosis not present

## 2021-02-17 DIAGNOSIS — E1165 Type 2 diabetes mellitus with hyperglycemia: Secondary | ICD-10-CM | POA: Diagnosis not present

## 2021-02-17 DIAGNOSIS — E7800 Pure hypercholesterolemia, unspecified: Secondary | ICD-10-CM | POA: Diagnosis not present

## 2021-02-17 DIAGNOSIS — E1169 Type 2 diabetes mellitus with other specified complication: Secondary | ICD-10-CM | POA: Diagnosis not present

## 2021-02-17 DIAGNOSIS — I10 Essential (primary) hypertension: Secondary | ICD-10-CM | POA: Diagnosis not present

## 2021-02-17 DIAGNOSIS — K219 Gastro-esophageal reflux disease without esophagitis: Secondary | ICD-10-CM | POA: Diagnosis not present

## 2021-02-24 DIAGNOSIS — H5212 Myopia, left eye: Secondary | ICD-10-CM | POA: Diagnosis not present

## 2021-02-24 DIAGNOSIS — Z7984 Long term (current) use of oral hypoglycemic drugs: Secondary | ICD-10-CM | POA: Diagnosis not present

## 2021-02-24 DIAGNOSIS — H5201 Hypermetropia, right eye: Secondary | ICD-10-CM | POA: Diagnosis not present

## 2021-02-24 DIAGNOSIS — E119 Type 2 diabetes mellitus without complications: Secondary | ICD-10-CM | POA: Diagnosis not present

## 2021-02-24 DIAGNOSIS — Z961 Presence of intraocular lens: Secondary | ICD-10-CM | POA: Diagnosis not present

## 2021-02-24 DIAGNOSIS — H52202 Unspecified astigmatism, left eye: Secondary | ICD-10-CM | POA: Diagnosis not present

## 2021-02-24 DIAGNOSIS — H524 Presbyopia: Secondary | ICD-10-CM | POA: Diagnosis not present

## 2021-04-08 DIAGNOSIS — I10 Essential (primary) hypertension: Secondary | ICD-10-CM | POA: Diagnosis not present

## 2021-04-08 DIAGNOSIS — E1169 Type 2 diabetes mellitus with other specified complication: Secondary | ICD-10-CM | POA: Diagnosis not present

## 2021-04-08 DIAGNOSIS — E7800 Pure hypercholesterolemia, unspecified: Secondary | ICD-10-CM | POA: Diagnosis not present

## 2021-04-08 DIAGNOSIS — E1165 Type 2 diabetes mellitus with hyperglycemia: Secondary | ICD-10-CM | POA: Diagnosis not present

## 2021-04-08 DIAGNOSIS — K219 Gastro-esophageal reflux disease without esophagitis: Secondary | ICD-10-CM | POA: Diagnosis not present

## 2021-06-03 DIAGNOSIS — E1165 Type 2 diabetes mellitus with hyperglycemia: Secondary | ICD-10-CM | POA: Diagnosis not present

## 2021-06-03 DIAGNOSIS — I10 Essential (primary) hypertension: Secondary | ICD-10-CM | POA: Diagnosis not present

## 2021-06-03 DIAGNOSIS — E7800 Pure hypercholesterolemia, unspecified: Secondary | ICD-10-CM | POA: Diagnosis not present

## 2021-06-17 DIAGNOSIS — E7800 Pure hypercholesterolemia, unspecified: Secondary | ICD-10-CM | POA: Diagnosis not present

## 2021-06-17 DIAGNOSIS — E1169 Type 2 diabetes mellitus with other specified complication: Secondary | ICD-10-CM | POA: Diagnosis not present

## 2021-06-17 DIAGNOSIS — I10 Essential (primary) hypertension: Secondary | ICD-10-CM | POA: Diagnosis not present

## 2021-07-29 DIAGNOSIS — Z0000 Encounter for general adult medical examination without abnormal findings: Secondary | ICD-10-CM | POA: Diagnosis not present

## 2021-07-29 DIAGNOSIS — I10 Essential (primary) hypertension: Secondary | ICD-10-CM | POA: Diagnosis not present

## 2021-07-29 DIAGNOSIS — R413 Other amnesia: Secondary | ICD-10-CM | POA: Diagnosis not present

## 2021-07-29 DIAGNOSIS — E1165 Type 2 diabetes mellitus with hyperglycemia: Secondary | ICD-10-CM | POA: Diagnosis not present

## 2021-07-29 DIAGNOSIS — Z1389 Encounter for screening for other disorder: Secondary | ICD-10-CM | POA: Diagnosis not present

## 2021-11-03 DIAGNOSIS — I10 Essential (primary) hypertension: Secondary | ICD-10-CM | POA: Diagnosis not present

## 2021-11-03 DIAGNOSIS — R413 Other amnesia: Secondary | ICD-10-CM | POA: Diagnosis not present

## 2021-11-03 DIAGNOSIS — M5430 Sciatica, unspecified side: Secondary | ICD-10-CM | POA: Diagnosis not present

## 2021-11-03 DIAGNOSIS — E1165 Type 2 diabetes mellitus with hyperglycemia: Secondary | ICD-10-CM | POA: Diagnosis not present

## 2021-12-14 DIAGNOSIS — I10 Essential (primary) hypertension: Secondary | ICD-10-CM | POA: Diagnosis not present

## 2021-12-19 DIAGNOSIS — H6123 Impacted cerumen, bilateral: Secondary | ICD-10-CM | POA: Diagnosis not present

## 2022-02-16 DIAGNOSIS — R197 Diarrhea, unspecified: Secondary | ICD-10-CM | POA: Diagnosis not present

## 2022-02-16 DIAGNOSIS — I10 Essential (primary) hypertension: Secondary | ICD-10-CM | POA: Diagnosis not present

## 2022-02-16 DIAGNOSIS — R269 Unspecified abnormalities of gait and mobility: Secondary | ICD-10-CM | POA: Diagnosis not present

## 2022-02-16 DIAGNOSIS — E1165 Type 2 diabetes mellitus with hyperglycemia: Secondary | ICD-10-CM | POA: Diagnosis not present

## 2022-02-16 DIAGNOSIS — M5430 Sciatica, unspecified side: Secondary | ICD-10-CM | POA: Diagnosis not present

## 2022-02-16 DIAGNOSIS — R413 Other amnesia: Secondary | ICD-10-CM | POA: Diagnosis not present

## 2022-02-20 DIAGNOSIS — R197 Diarrhea, unspecified: Secondary | ICD-10-CM | POA: Diagnosis not present

## 2022-02-23 DIAGNOSIS — R269 Unspecified abnormalities of gait and mobility: Secondary | ICD-10-CM | POA: Diagnosis not present

## 2022-03-22 DIAGNOSIS — E1165 Type 2 diabetes mellitus with hyperglycemia: Secondary | ICD-10-CM | POA: Diagnosis not present

## 2022-04-07 DIAGNOSIS — E1169 Type 2 diabetes mellitus with other specified complication: Secondary | ICD-10-CM | POA: Diagnosis not present

## 2022-04-07 DIAGNOSIS — K219 Gastro-esophageal reflux disease without esophagitis: Secondary | ICD-10-CM | POA: Diagnosis not present

## 2022-04-07 DIAGNOSIS — E7800 Pure hypercholesterolemia, unspecified: Secondary | ICD-10-CM | POA: Diagnosis not present

## 2022-04-07 DIAGNOSIS — I10 Essential (primary) hypertension: Secondary | ICD-10-CM | POA: Diagnosis not present

## 2022-12-24 IMAGING — CT CT HEAD WO/W CM
1 of 2 series · 13 of 30 positions shown, 17 images · IV contrast (iopamidol)
Comparison: None.

CLINICAL DATA: Non intractable headache, nausea and vomiting

EXAM:
CT HEAD WITHOUT AND WITH CONTRAST
TECHNIQUE: Contiguous axial images were obtained from the base of the skull
through the vertex without and with intravenous contrast
CONTRAST:  75mL QOXNAW-VMM IOPAMIDOL (QOXNAW-VMM) INJECTION 61%
Creatinine was obtained on site at [HOSPITAL] at [HOSPITAL].
Results: Creatinine 0.7 mg/dL.

[Series 2: head w/(date) · axial · 0.42mm/px · z∈[-156,-21]mm · 13 of 33 slices shown, 17 images]
[im 3/33  brain]
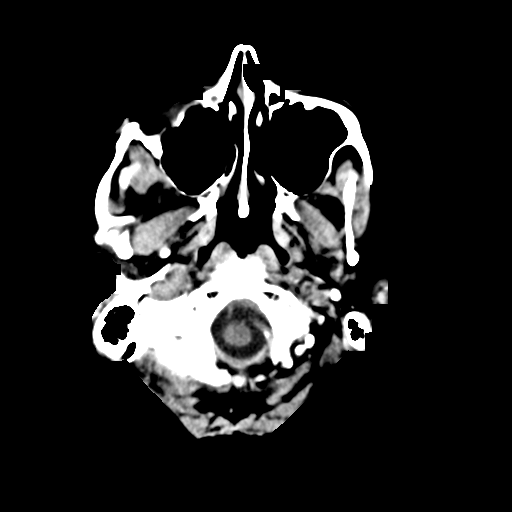
[im 3/33  bone]
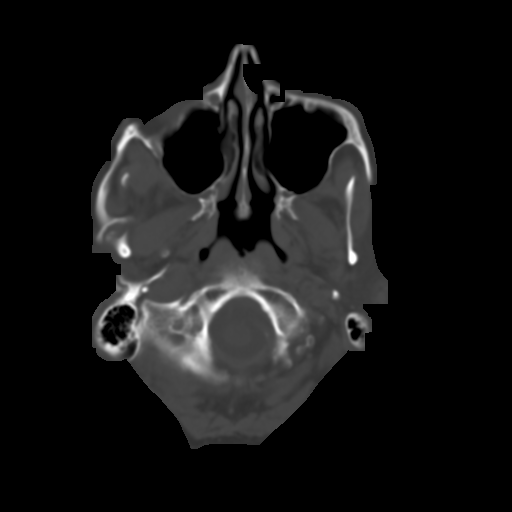
[im 5/33  brain]
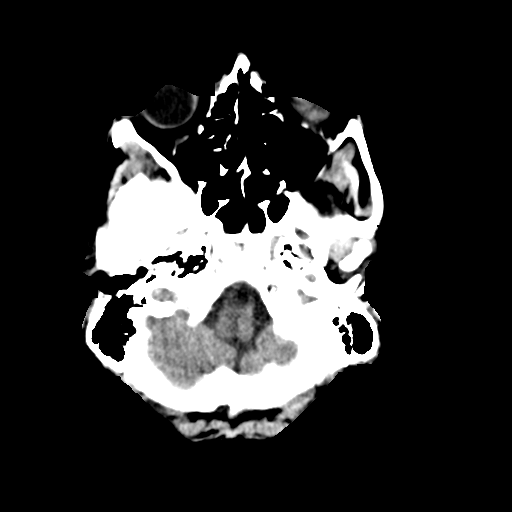
[im 7/33  brain]
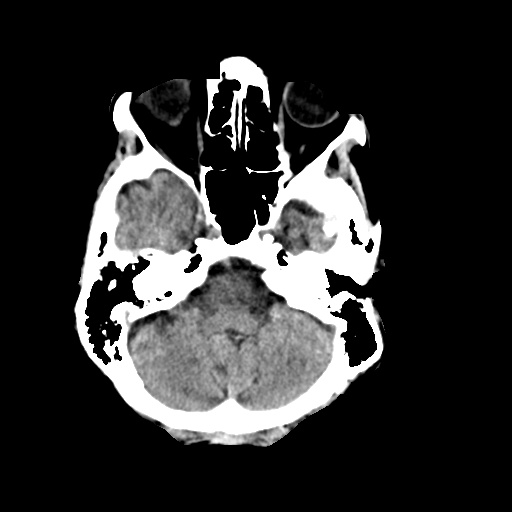
[im 10/33  brain]
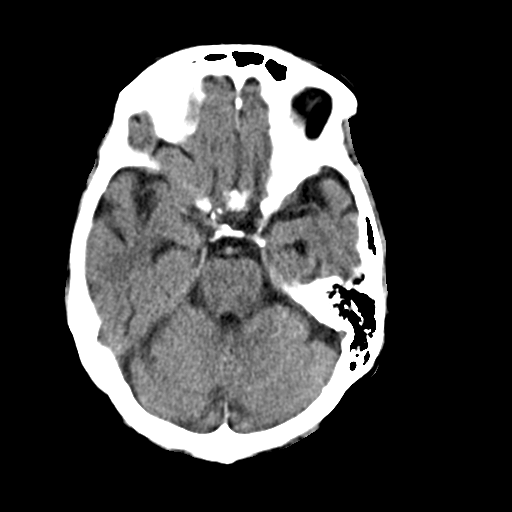
[im 12/33  brain]
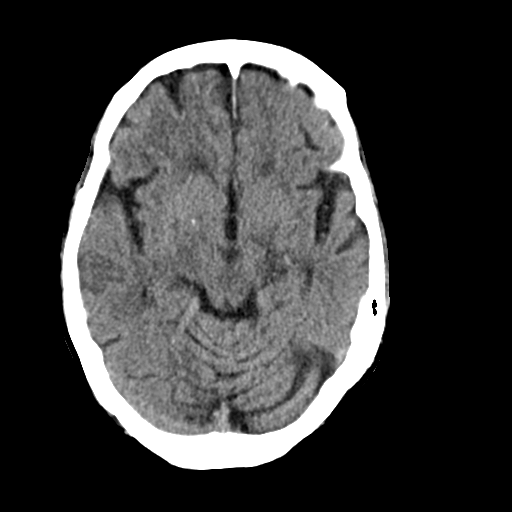
[im 12/33  bone]
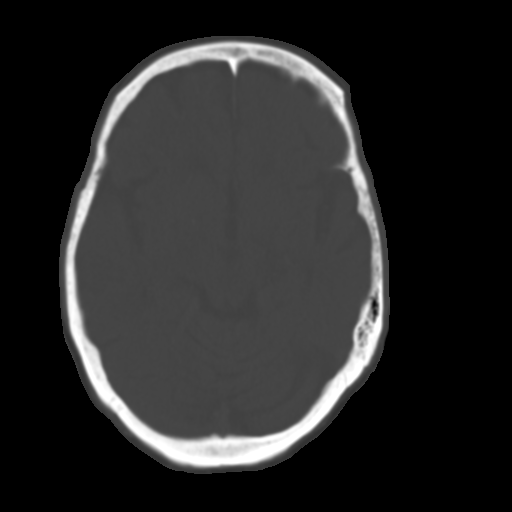
[im 14/33  brain]
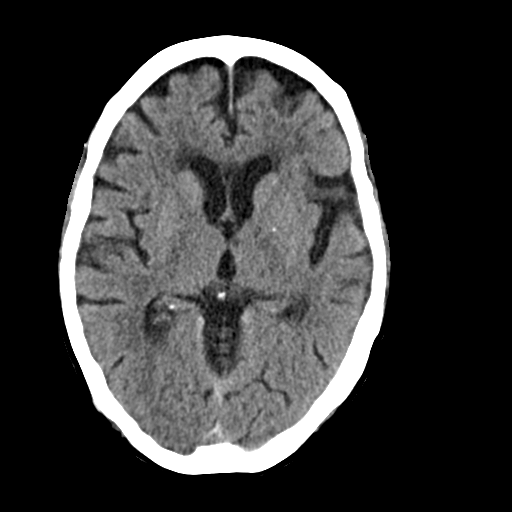
[im 17/33  brain]
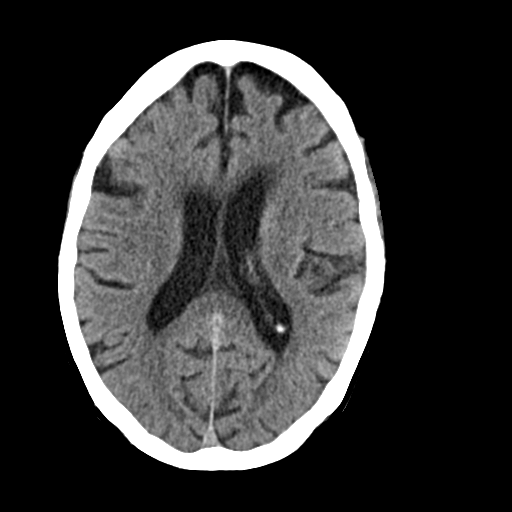
[im 19/33  brain]
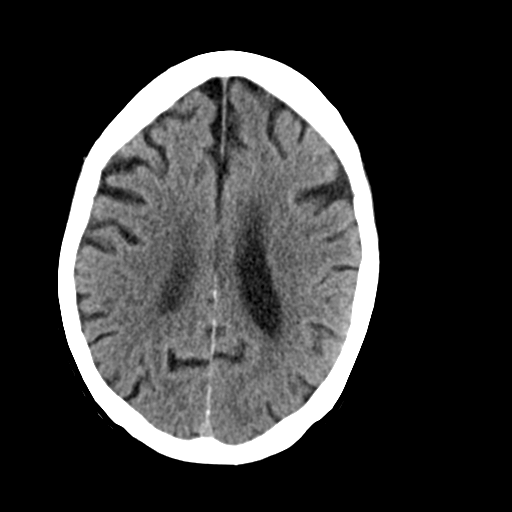
[im 21/33  brain]
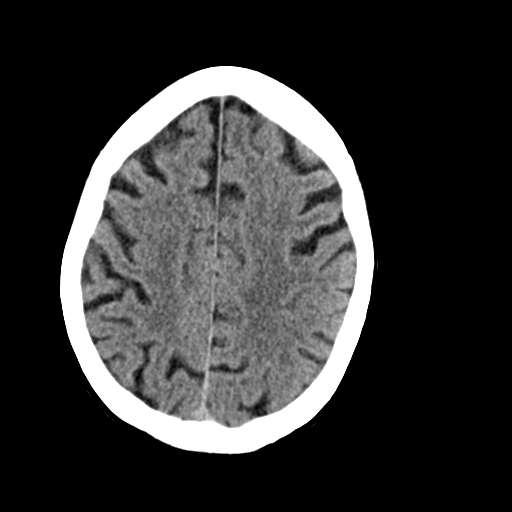
[im 21/33  bone]
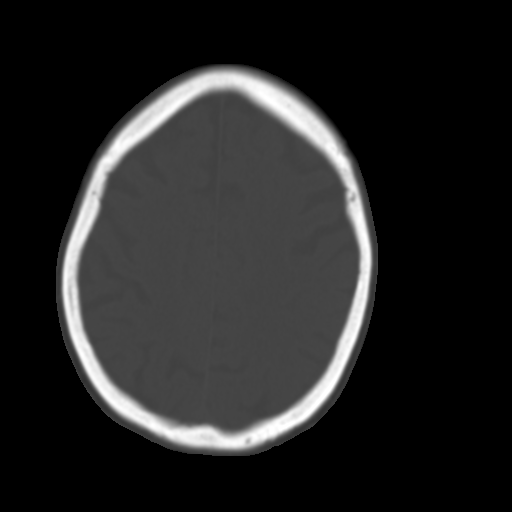
[im 23/33  brain]
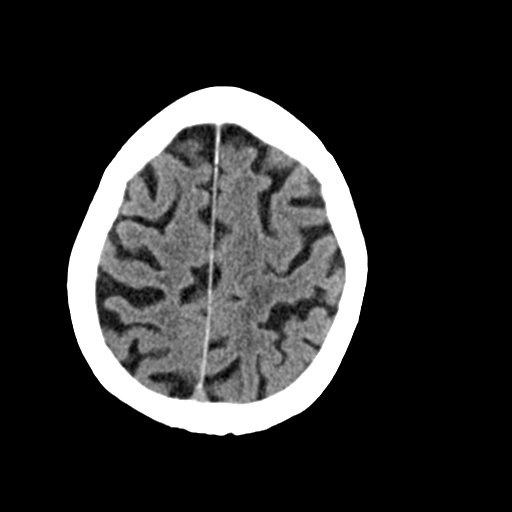
[im 26/33  brain]
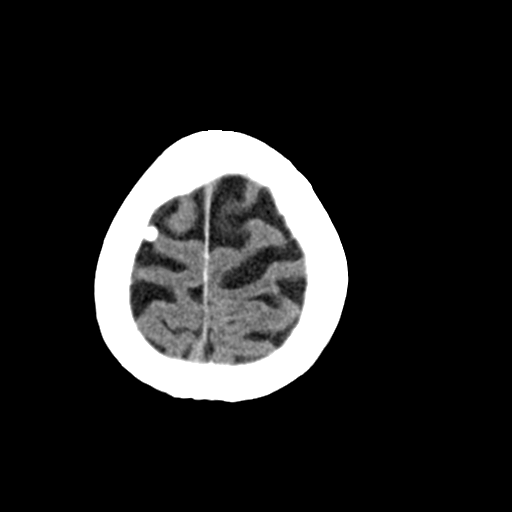
[im 28/33  brain]
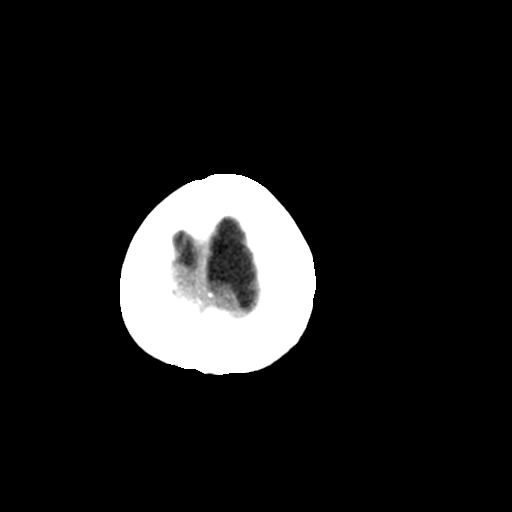
[im 30/33  brain]
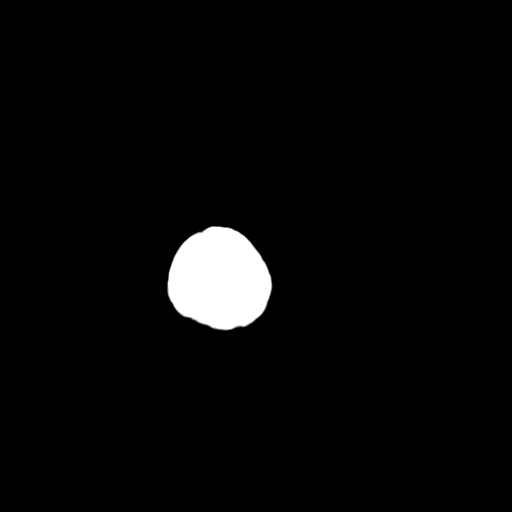
[im 30/33  bone]
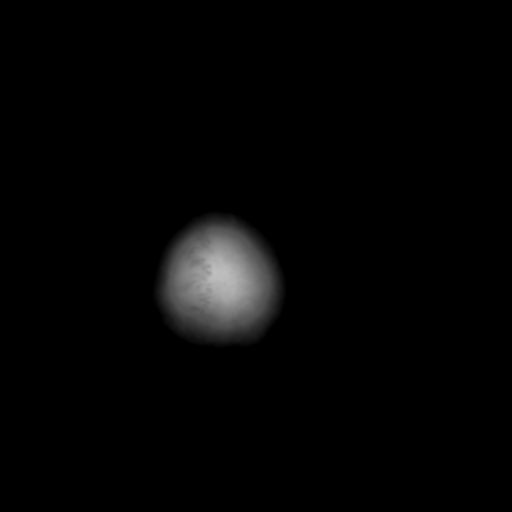

[13 of 30 positions shown; findings below may reference images not displayed]

FINDINGS: Brain: There is no acute intracranial hemorrhage, mass, mass effect,
or edema. No abnormal enhancement. Gray-white differentiation is
preserved. There is no extra-axial fluid collection. Prominence of
the ventricles and sulci reflects parenchymal volume loss. Patchy
and confluent hypoattenuation in the supratentorial white matter is
nonspecific but probably reflects mild to moderate chronic
microvascular ischemic changes. There is age-indeterminate but
probably chronic small vessel infarct of the medial left thalamus.

Vascular: There is atherosclerotic calcification at the skull base.

Skull: Calvarium is unremarkable.

Sinuses/Orbits: No acute finding.

Other: None.
IMPRESSION: No acute intracranial abnormality.  No mass or abnormal enhancement.

Mild to moderate chronic microvascular ischemic changes.
Age-indeterminate but probably chronic small vessel infarct of the
left thalamus.

## 2023-01-03 DIAGNOSIS — R002 Palpitations: Secondary | ICD-10-CM | POA: Insufficient documentation

## 2023-01-03 NOTE — Progress Notes (Unsigned)
Cardiology Office Note   Date:  01/04/2023   ID:  Claudia Santana, DOB 05-Jan-1941, MRN [id number]  PCP:  Marden Noble, MD  Cardiologist:   None Referring:  Marden Noble, MD   Chief Complaint  Patient presents with   Palpitations    History of Present Illness: Claudia Santana is a 82 y.o. female who presents for evaluation of palpitations.  She saw Dr. [place] in 2018.  She had palpitations.  No work up was needed.       She recently saw her primary provider.  She wore a monitor which has able to review.  There were some PACs.  She was started on a beta-blocker.  The patient does not recall really having the symptoms though her family says she had some palpitations.  She clearly did not have syncope.  She does not describe presyncope.  She lives by herself but is on that she has 24/7 coverage with healthcare workers and family in her house.  She gets around and does not report any other symptoms such as chest pressure, neck or arm discomfort.  She is not reporting any shortness of breath, PND or orthopnea.   Past Medical History:  Diagnosis Date   GERD (gastroesophageal reflux disease)    Hyperlipidemia    Hypertension    Plantar fasciitis    Psoriatic arthritis ([facility])    Seborrheic keratoses    Vitamin D deficiency     Past Surgical History:  Procedure Laterality Date   TOTAL ABDOMINAL HYSTERECTOMY       Current Outpatient Medications  Medication Sig Dispense Refill   amLODipine-valsartan (EXFORGE) 5-160 MG tablet Take 0.5 tablets by mouth daily.     Biotin 10 MG TABS Take 1 tablet by mouth daily.     Cholecalciferol (VITAMIN D3) 2000 units capsule Take 1 capsule by mouth daily.     cyanocobalamin (VITAMIN B12) 1000 MCG/ML injection Inject 1,000 mcg into the muscle every 30 (thirty) days.     fexofenadine (ALLEGRA ALLERGY) 180 MG tablet Take 1 tablet by mouth daily.     fluticasone (FLONASE) 50 MCG/ACT nasal spray Place 2 sprays into both nostrils daily as  needed.     Ginger 500 MG CAPS Take 500 mg by mouth daily as needed.     Magnesium 300 MG CAPS Take 2 capsules by mouth daily.     metFORMIN (GLUCOPHAGE) 500 MG tablet Take 1,000 mg by mouth 2 (two) times daily.     metoprolol tartrate (LOPRESSOR) 25 MG tablet Take 12.5 mg by mouth daily.     Multiple Vitamin (MULTI-VITAMINS) TABS Take 1 tablet by mouth daily.     niacin (VITAMIN B3) 500 MG tablet Take 500 mg by mouth daily.     Zinc 10 MG LOZG Take 10 mg by mouth daily.     calcium carbonate (CALCIUM 600) 600 MG TABS tablet Take 2 tablets by mouth daily. (Patient not taking: Reported on 01/04/2023)     Dulaglutide (TRULICITY) 0.75 MG/0.5ML SOPN Inject 0.75 mg into the skin once a week. (Patient not taking: Reported on 01/04/2023)     esomeprazole (NEXIUM) 40 MG capsule Take 40 mg by mouth 3 (three) times a week. (Patient not taking: Reported on 01/04/2023)     Estradiol 50 MG PLLT Take 1 tablet by mouth 3 (three) times a week. (Patient not taking: Reported on 01/04/2023)     Inositol Niacinate (NIACIN FLUSH FREE) 500 MG CAPS Take 1 capsule by mouth daily. (Patient  not taking: Reported on 01/04/2023)     metFORMIN (GLUCOPHAGE-XR) 500 MG 24 hr tablet Take 2 tablets by mouth daily. (Patient not taking: Reported on 01/04/2023)     Probiotic CAPS  (Patient not taking: Reported on 01/04/2023)     No current facility-administered medications for this visit.    Allergies:   Amoxicillin, Atorvastatin, Ezetimibe, Niaspan [niacin er], and Tiazac [diltiazem hcl er beads]    Social History:  The patient  reports that she has never smoked. She has never used smokeless tobacco.   Family History:  The patient's family history includes Arthritis in her sister; CVA in her mother; Diabetes in her father; Heart disease in her mother; Hypertension in her father.    ROS:  Please see the history of present illness.   Otherwise, review of systems are positive for none.   All other systems are reviewed and negative.     PHYSICAL EXAM: VS:  BP (!) 120/42   Pulse 78   Ht 5\' 7"  (1.702 m)   Wt 142 lb (64.4 kg)   SpO2 97%   BMI 22.24 kg/m  , BMI Body mass index is 22.24 kg/m. GENERAL:  Well appearing HEENT:  Pupils equal round and reactive, fundi not visualized, oral mucosa unremarkable NECK:  No jugular venous distention, waveform within normal limits, carotid upstroke brisk and symmetric, left carotid bruits, no thyromegaly LYMPHATICS:  No cervical, inguinal adenopathy LUNGS:  Clear to auscultation bilaterally BACK:  No CVA tenderness CHEST:  Unremarkable HEART:  PMI not displaced or sustained,S1 and S2 within normal limits, no S3, no S4, no clicks, no rubs, no murmurs ABD:  Flat, positive bowel sounds normal in frequency in pitch, no bruits, no rebound, no guarding, no midline pulsatile mass, no hepatomegaly, no splenomegaly EXT:  2 plus pulses throughout, no edema, no cyanosis no clubbing SKIN:  No rashes no nodules NEURO:  Cranial nerves II through XII grossly intact, motor grossly intact throughout [facility]:  Cognitively intact, oriented to person place and time    EKG:  EKG Interpretation Date/Time:  Thursday January 04 2023 13:09:56 EDT Ventricular Rate:  75 PR Interval:  138 QRS Duration:  96 QT Interval:  384 QTC Calculation: 428 R Axis:   40  Text Interpretation: Normal sinus rhythm Poor anterior R wave progression No significant change since last tracing Confirmed by Rollene Rotunda ([id number]) on 01/04/2023 1:12:01 PM     Recent Labs: No results found for requested labs within last 365 days.    Lipid Panel No results found for: "CHOL", "TRIG", "HDL", "CHOLHDL", "VLDL", "LDLCALC", "LDLDIRECT"    Wt Readings from Last 3 Encounters:  01/04/23 142 lb (64.4 kg)  05/17/16 170 lb 3.2 oz (77.2 kg)      Other studies Reviewed: Additional studies/ records that were reviewed today include: Primary care records, event monitor. Review of the above records demonstrates:  Please see  elsewhere in the note.     ASSESSMENT AND PLAN:  Palpitations: The patient really does not have any symptoms.  They have actually weaned down beta-blocker which she was taking twice daily low-dose.  She is only been taking this once a day.  I think she can discontinue this.  She can let me know if she has any increasing palpitations.  We did review her blood work and her chemistry was unremarkable.  Bruit: She will need carotid Dopplers.   Current medicines are reviewed at length with the patient today.  The patient does not have concerns regarding medicines.  The following changes have been made:  no change  Labs/ tests ordered today include:   Orders Placed This Encounter  Procedures   EKG 12-Lead   VAS US CAROTID     Disposition:   FU with with me as needed.     Signed, Rollene Rotunda, MD  01/04/2023 1:46 PM    [facility]

## 2023-01-04 ENCOUNTER — Ambulatory Visit: Payer: Medicare Other | Attending: Cardiology | Admitting: Cardiology

## 2023-01-04 ENCOUNTER — Encounter: Payer: Self-pay | Admitting: Cardiology

## 2023-01-04 VITALS — BP 120/42 | HR 78 | Ht 67.0 in | Wt 142.0 lb

## 2023-01-04 DIAGNOSIS — R0989 Other specified symptoms and signs involving the circulatory and respiratory systems: Secondary | ICD-10-CM | POA: Diagnosis not present

## 2023-01-04 DIAGNOSIS — R002 Palpitations: Secondary | ICD-10-CM

## 2023-01-04 NOTE — Patient Instructions (Signed)
Medication Instructions:  NO CHANGES  *If you need a refill on your cardiac medications before your next appointment, please call your pharmacy*  Testing/Procedures: Your physician has requested that you have a carotid duplex. This test is an ultrasound of the carotid arteries in your neck. It looks at blood flow through these arteries that supply the brain with blood. Allow one hour for this exam. There are no restrictions or special instructions. This test is done at Dr. Jenene Slicker office   Follow-Up: At [facility], you and your health needs are our priority.  As part of our continuing mission to provide you with exceptional heart care, we have created designated Provider Care Teams.  These Care Teams include your primary Cardiologist (physician) and Advanced Practice Providers (APPs -  Physician Assistants and Nurse Practitioners) who all work together to provide you with the care you need, when you need it.  We recommend signing up for the patient portal called "MyChart".  Sign up information is provided on this After Visit Summary.  MyChart is used to connect with patients for Virtual Visits (Telemedicine).  Patients are able to view lab/test results, encounter notes, upcoming appointments, etc.  Non-urgent messages can be sent to your provider as well.   To learn more about what you can do with MyChart, go to [email or web address].    Your next appointment:   AS NEEDED with Dr. Antoine Poche

## 2023-01-12 ENCOUNTER — Ambulatory Visit (HOSPITAL_COMMUNITY)
Admission: RE | Admit: 2023-01-12 | Discharge: 2023-01-12 | Disposition: A | Discharge status: Home / Self Care | Payer: Medicare Other | Source: Ambulatory Visit | Attending: Cardiology | Admitting: Cardiology

## 2023-01-12 DIAGNOSIS — R0989 Other specified symptoms and signs involving the circulatory and respiratory systems: Secondary | ICD-10-CM | POA: Diagnosis present

## 2023-01-15 ENCOUNTER — Encounter: Payer: Self-pay | Admitting: *Deleted

## 2023-01-15 ENCOUNTER — Encounter: Payer: Self-pay | Admitting: Cardiology

## 2023-02-01 ENCOUNTER — Ambulatory Visit: Payer: Medicare Other | Admitting: Cardiology
